# Patient Record
Sex: Male | Born: 1975 | Race: Black or African American | Hispanic: No | Marital: Single | Smoking: Current every day smoker
Health system: Southern US, Community
[De-identification: ages and names within clinical notes are randomized; demographics above are authoritative.]

## PROBLEM LIST (undated history)

## (undated) DIAGNOSIS — T148XXA Other injury of unspecified body region, initial encounter: Secondary | ICD-10-CM

## (undated) DIAGNOSIS — I10 Essential (primary) hypertension: Secondary | ICD-10-CM

## (undated) HISTORY — PX: OTHER SURGICAL HISTORY: SHX169

---

## 1999-12-23 ENCOUNTER — Emergency Department (HOSPITAL_COMMUNITY): Admission: EM | Admit: 1999-12-23 | Discharge: 1999-12-23 | Payer: Self-pay | Admitting: Emergency Medicine

## 2000-01-12 ENCOUNTER — Encounter: Payer: Self-pay | Admitting: Emergency Medicine

## 2000-01-12 ENCOUNTER — Emergency Department (HOSPITAL_COMMUNITY): Admission: EM | Admit: 2000-01-12 | Discharge: 2000-01-12 | Payer: Self-pay | Admitting: Internal Medicine

## 2000-01-14 ENCOUNTER — Emergency Department (HOSPITAL_COMMUNITY): Admission: EM | Admit: 2000-01-14 | Discharge: 2000-01-14 | Payer: Self-pay | Admitting: Emergency Medicine

## 2000-01-21 ENCOUNTER — Emergency Department (HOSPITAL_COMMUNITY): Admission: EM | Admit: 2000-01-21 | Discharge: 2000-01-21 | Payer: Self-pay | Admitting: Emergency Medicine

## 2000-04-12 ENCOUNTER — Emergency Department (HOSPITAL_COMMUNITY): Admission: EM | Admit: 2000-04-12 | Discharge: 2000-04-12 | Payer: Self-pay | Admitting: Emergency Medicine

## 2003-11-17 ENCOUNTER — Emergency Department (HOSPITAL_COMMUNITY): Admission: EM | Admit: 2003-11-17 | Discharge: 2003-11-17 | Payer: Self-pay | Admitting: Emergency Medicine

## 2004-05-24 ENCOUNTER — Emergency Department (HOSPITAL_COMMUNITY): Admission: EM | Admit: 2004-05-24 | Discharge: 2004-05-24 | Payer: Self-pay | Admitting: *Deleted

## 2005-05-18 ENCOUNTER — Emergency Department (HOSPITAL_COMMUNITY): Admission: EM | Admit: 2005-05-18 | Discharge: 2005-05-18 | Payer: Self-pay | Admitting: Emergency Medicine
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
## 2007-01-06 ENCOUNTER — Emergency Department (HOSPITAL_COMMUNITY): Admission: EM | Admit: 2007-01-06 | Discharge: 2007-01-06 | Payer: Self-pay | Admitting: Emergency Medicine

## 2007-09-13 ENCOUNTER — Emergency Department (HOSPITAL_COMMUNITY): Admission: EM | Admit: 2007-09-13 | Discharge: 2007-09-13 | Payer: Self-pay | Admitting: Family Medicine

## 2008-03-10 ENCOUNTER — Emergency Department (HOSPITAL_COMMUNITY): Admission: EM | Admit: 2008-03-10 | Discharge: 2008-03-10 | Payer: Self-pay | Admitting: Emergency Medicine

## 2008-06-12 ENCOUNTER — Emergency Department (HOSPITAL_COMMUNITY): Admission: EM | Admit: 2008-06-12 | Discharge: 2008-06-12 | Payer: Self-pay | Admitting: Emergency Medicine

## 2011-05-23 LAB — DIFFERENTIAL
Basophils Absolute: 0
Basophils Relative: 1
Eosinophils Absolute: 0.1
Eosinophils Relative: 1
Lymphocytes Relative: 24
Lymphs Abs: 1.7
Monocytes Absolute: 0.7
Monocytes Relative: 9
Neutro Abs: 4.7
Neutrophils Relative %: 65

## 2011-05-23 LAB — POCT I-STAT, CHEM 8
Calcium, Ion: 1.19
Creatinine, Ser: 1.2
Glucose, Bld: 92
HCT: 48
Hemoglobin: 16.3
Potassium: 4.4
TCO2: 29

## 2011-05-23 LAB — POCT CARDIAC MARKERS
CKMB, poc: 1.5
Myoglobin, poc: 27.9
Operator id: 234501

## 2011-05-23 LAB — CBC
HCT: 43.2
Hemoglobin: 14.8
MCHC: 34.2
MCV: 93.6
Platelets: 170
RBC: 4.62
RDW: 14
WBC: 7.1

## 2019-12-24 ENCOUNTER — Ambulatory Visit: Payer: Self-pay | Attending: Internal Medicine

## 2019-12-24 DIAGNOSIS — Z23 Encounter for immunization: Secondary | ICD-10-CM

## 2019-12-24 NOTE — Progress Notes (Signed)
   Covid-19 Vaccination Clinic  Name:  Warren Roth    MRN: [id number] DOB: 1975-08-30  12/24/2019  Mr. Hingle was observed post Covid-19 immunization for 15 minutes without incident. He was provided with Vaccine Information Sheet and instruction to access the V-Safe system.   Mr. Frazer was instructed to call 911 with any severe reactions post vaccine: Marland Kitchen Difficulty breathing  . Swelling of face and throat  . A fast heartbeat  . A bad rash all over body  . Dizziness and weakness   Immunizations Administered    Name Date Dose VIS Date Route   Pfizer COVID-19 Vaccine 12/24/2019 11:37 AM 0.3 mL 10/19/2018 Intramuscular   Manufacturer: [employer], [employer]   Lot: [id number]   NDC: [address]-3

## 2020-01-19 ENCOUNTER — Ambulatory Visit: Payer: Self-pay

## 2020-01-19 ENCOUNTER — Ambulatory Visit: Payer: Self-pay | Attending: Internal Medicine

## 2020-01-19 DIAGNOSIS — Z23 Encounter for immunization: Secondary | ICD-10-CM

## 2020-01-19 NOTE — Progress Notes (Signed)
° °  Covid-19 Vaccination Clinic  Name:  Warren Roth    MRN: [id number] DOB: 1976-02-13  01/19/2020  [name] was observed post Covid-19 immunization for 15 minutes without incident. He was provided with Vaccine Information Sheet and instruction to access the V-Safe system.   [name] was instructed to call 911 with any severe reactions post vaccine:  Difficulty breathing   Swelling of face and throat   A fast heartbeat   A bad rash all over body   Dizziness and weakness   Immunizations Administered    Name Date Dose VIS Date Route   Pfizer COVID-19 Vaccine 01/19/2020  8:14 AM 0.3 mL 10/19/2018 Intramuscular   Manufacturer: [employer], [employer]   Lot: [id number]   NDC: [address]-7

## 2020-01-21 ENCOUNTER — Ambulatory Visit: Payer: Self-pay

## 2020-07-07 ENCOUNTER — Ambulatory Visit: Payer: Self-pay

## 2020-08-11 ENCOUNTER — Ambulatory Visit: Payer: Self-pay | Attending: Internal Medicine

## 2020-08-11 DIAGNOSIS — Z23 Encounter for immunization: Secondary | ICD-10-CM

## 2020-08-11 NOTE — Progress Notes (Signed)
   Covid-19 Vaccination Clinic  Name:  IMRE VECCHIONE    MRN: [id number] DOB: 08/07/1976  08/11/2020  Mr. Manfredo was observed post Covid-19 immunization for 15 minutes without incident. He was provided with Vaccine Information Sheet and instruction to access the V-Safe system.   Mr. Vogelsang was instructed to call 911 with any severe reactions post vaccine: Marland Kitchen Difficulty breathing  . Swelling of face and throat  . A fast heartbeat  . A bad rash all over body  . Dizziness and weakness   Immunizations Administered    Name Date Dose VIS Date Route   Pfizer COVID-19 Vaccine 08/11/2020  9:49 AM 0.3 mL 06/13/2020 Intramuscular   Manufacturer: [employer], [employer]   Lot: [id number]   NDC: [address]-5

## 2021-02-15 ENCOUNTER — Ambulatory Visit (HOSPITAL_COMMUNITY)
Admission: EM | Admit: 2021-02-15 | Discharge: 2021-02-15 | Disposition: A | Discharge status: Home / Self Care | Payer: 59 | Attending: Internal Medicine | Admitting: Internal Medicine

## 2021-02-15 DIAGNOSIS — Z20822 Contact with and (suspected) exposure to covid-19: Secondary | ICD-10-CM | POA: Insufficient documentation

## 2021-02-15 LAB — SARS CORONAVIRUS 2 (TAT 6-24 HRS): SARS Coronavirus 2: NEGATIVE

## 2021-02-15 NOTE — ED Triage Notes (Signed)
Pt presents for COVID testing. Pt denies any symptoms at this time.

## 2021-03-23 ENCOUNTER — Ambulatory Visit (HOSPITAL_COMMUNITY): Admission: EM | Admit: 2021-03-23 | Discharge: 2021-03-23 | Discharge status: Against Medical Advice | Payer: 59

## 2021-03-23 ENCOUNTER — Other Ambulatory Visit: Payer: Self-pay

## 2021-10-08 ENCOUNTER — Other Ambulatory Visit: Payer: Self-pay

## 2021-10-08 ENCOUNTER — Encounter (HOSPITAL_COMMUNITY): Payer: Self-pay | Admitting: Emergency Medicine

## 2021-10-08 ENCOUNTER — Emergency Department (HOSPITAL_COMMUNITY)
Admission: EM | Admit: 2021-10-08 | Discharge: 2021-10-08 | Disposition: A | Discharge status: Home / Self Care | Payer: 59 | Attending: Emergency Medicine | Admitting: Emergency Medicine

## 2021-10-08 DIAGNOSIS — R369 Urethral discharge, unspecified: Secondary | ICD-10-CM

## 2021-10-08 DIAGNOSIS — N342 Other urethritis: Secondary | ICD-10-CM | POA: Insufficient documentation

## 2021-10-08 DIAGNOSIS — I10 Essential (primary) hypertension: Secondary | ICD-10-CM

## 2021-10-08 HISTORY — DX: Essential (primary) hypertension: I10

## 2021-10-08 LAB — CBC WITH DIFFERENTIAL/PLATELET
Abs Immature Granulocytes: 0.01 10*3/uL (ref 0.00–0.07)
Basophils Absolute: 0 10*3/uL (ref 0.0–0.1)
Basophils Relative: 1 %
Eosinophils Absolute: 0 10*3/uL (ref 0.0–0.5)
Eosinophils Relative: 0 %
HCT: 35.8 % — ABNORMAL LOW (ref 39.0–52.0)
Hemoglobin: 12.1 g/dL — ABNORMAL LOW (ref 13.0–17.0)
Immature Granulocytes: 0 %
Lymphocytes Relative: 55 %
Lymphs Abs: 4.3 10*3/uL — ABNORMAL HIGH (ref 0.7–4.0)
MCH: 35.5 pg — ABNORMAL HIGH (ref 26.0–34.0)
MCHC: 33.8 g/dL (ref 30.0–36.0)
MCV: 105 fL — ABNORMAL HIGH (ref 80.0–100.0)
Monocytes Absolute: 0.8 10*3/uL (ref 0.1–1.0)
Monocytes Relative: 10 %
Neutro Abs: 2.6 10*3/uL (ref 1.7–7.7)
Neutrophils Relative %: 34 %
Platelets: 193 10*3/uL (ref 150–400)
RBC: 3.41 MIL/uL — ABNORMAL LOW (ref 4.22–5.81)
RDW: 13.1 % (ref 11.5–15.5)
WBC: 7.7 10*3/uL (ref 4.0–10.5)
nRBC: 0 % (ref 0.0–0.2)

## 2021-10-08 LAB — URINALYSIS, ROUTINE W REFLEX MICROSCOPIC
Bilirubin Urine: NEGATIVE
Glucose, UA: NEGATIVE mg/dL
Ketones, ur: 20 mg/dL — AB
Nitrite: NEGATIVE
Protein, ur: 30 mg/dL — AB
Specific Gravity, Urine: 1.017 (ref 1.005–1.030)
WBC, UA: >50 WBC/hpf — ABNORMAL HIGH (ref 0–5)
pH: 7 (ref 5.0–8.0)

## 2021-10-08 LAB — BASIC METABOLIC PANEL
Anion gap: 13 (ref 5–15)
BUN: 12 mg/dL (ref 6–20)
CO2: 24 mmol/L (ref 22–32)
Calcium: 9.5 mg/dL (ref 8.9–10.3)
Chloride: 100 mmol/L (ref 98–111)
Creatinine, Ser: 0.67 mg/dL (ref 0.61–1.24)
GFR, Estimated: >60 mL/min (ref >60)
Glucose, Bld: 91 mg/dL (ref 70–99)
Potassium: 3.3 mmol/L — ABNORMAL LOW (ref 3.5–5.1)
Sodium: 137 mmol/L (ref 135–145)

## 2021-10-08 LAB — HIV ANTIBODY (ROUTINE TESTING W REFLEX): HIV Screen 4th Generation wRfx: NONREACTIVE

## 2021-10-08 MED ORDER — DOXYCYCLINE HYCLATE 100 MG PO CAPS: 100.0000 mg | ORAL_CAPSULE | Freq: Two times a day (BID) | ORAL | 0 refills | Status: DC

## 2021-10-08 MED ORDER — CEFTRIAXONE SODIUM 500 MG IJ SOLR
500.0000 mg | Freq: Once | INTRAMUSCULAR | Status: AC
  Administered 2021-10-08: 500 mg via INTRAMUSCULAR
  Filled 2021-10-08: qty 500

## 2021-10-08 NOTE — ED Provider Notes (Signed)
[facility] EMERGENCY DEPARTMENT Provider Note   CSN: [id number] Arrival date & time: 10/08/21  1850     History  Chief Complaint  Patient presents with   Exposure to STD    Warren Roth is a 46 y.o. male.  HPI He reports onset of urethral drainage, 2 days ago associated with dysuria.  He describes protected sex, several days ago.  No other complaints.    Home Medications Prior to Admission medications   Medication Sig Start Date End Date Taking? Authorizing Provider  doxycycline (VIBRAMYCIN) 100 MG capsule Take 1 capsule (100 mg total) by mouth 2 (two) times daily. One po bid x 7 days 10/08/21  Yes Mancel Bale, MD      Allergies    Patient has no known allergies.    Review of Systems   Review of Systems  Physical Exam Updated Vital Signs BP (!) 170/129 (BP Location: Right Arm)    Pulse 78    Temp 98.3 F (36.8 C) (Oral)    Resp 18    Ht 5' 6.5" (1.689 m)    Wt 68 kg    SpO2 98%    BMI 23.85 kg/m  Physical Exam Vitals and nursing note reviewed.  Constitutional:      General: He is not in acute distress.    Appearance: He is well-developed. He is not ill-appearing or diaphoretic.  HENT:     Head: Normocephalic and atraumatic.     Right Ear: External ear normal.     Left Ear: External ear normal.  Eyes:     Conjunctiva/sclera: Conjunctivae normal.     Pupils: Pupils are equal, round, and reactive to light.  Neck:     Trachea: Phonation normal.  Cardiovascular:     Rate and Rhythm: Normal rate.  Pulmonary:     Effort: Pulmonary effort is normal.  Abdominal:     General: There is no distension.  Genitourinary:    Comments: Circumcised, purulent urethral discharge present.  No other penile abnormality.  Normal scrotum and scrotal contents. Musculoskeletal:        General: Normal range of motion.     Cervical back: Normal range of motion and neck supple.  Skin:    General: Skin is warm and dry.  Neurological:     Mental Status: He is  alert and oriented to person, place, and time.     Cranial Nerves: No cranial nerve deficit.     Sensory: No sensory deficit.     Motor: No abnormal muscle tone.     Coordination: Coordination normal.  Psychiatric:        Mood and Affect: Mood normal.        Behavior: Behavior normal.        Thought Content: Thought content normal.        Judgment: Judgment normal.    ED Results / Procedures / Treatments   Labs (all labs ordered are listed, but only abnormal results are displayed) Labs Reviewed  BASIC METABOLIC PANEL - Abnormal; Notable for the following components:      Result Value   Potassium 3.3 (*)    All other components within normal limits  CBC WITH DIFFERENTIAL/PLATELET - Abnormal; Notable for the following components:   RBC 3.41 (*)    Hemoglobin 12.1 (*)    HCT 35.8 (*)    MCV 105.0 (*)    MCH 35.5 (*)    Lymphs Abs 4.3 (*)    All other components  within normal limits  URINALYSIS, ROUTINE W REFLEX MICROSCOPIC - Abnormal; Notable for the following components:   APPearance HAZY (*)    Hgb urine dipstick SMALL (*)    Ketones, ur 20 (*)    Protein, ur 30 (*)    Leukocytes,Ua LARGE (*)    WBC, UA >50 (*)    Bacteria, UA RARE (*)    All other components within normal limits  HIV ANTIBODY (ROUTINE TESTING W REFLEX)  GC/CHLAMYDIA PROBE AMP ([place]) NOT AT [facility]    EKG None  Radiology No results found.  Procedures Procedures    Medications Ordered in ED Medications  cefTRIAXone (ROCEPHIN) injection 500 mg (500 mg Intramuscular Given 10/08/21 2200)    ED Course/ Medical Decision Making/ A&P                           Medical Decision Making Patient presenting for evaluation of urethral discharge after unprotected sex with a new partner  Amount and/or Complexity of Data Reviewed Independent Historian:     Details: He is a cogent historian Labs: ordered.    Details: CBC, metabolic panel, urinalysis, GC chlamydia culture from urine-normal except  potassium low, hemoglobin low, MCV high, urinalysis abnormal with increased white cells, protein and ketones.  At time of discharge, HIV antibody, GC and chlamydia pending.  Risk Prescription drug management. Decision regarding hospitalization. Risk Details: Patient with signs and symptoms of gonococcal urethritis.  Patient will be treated for both GC and chlamydia.  Patient had incidental high blood pressure noted, rechecked and still high.  He has not had any signs or symptoms of hypertensive urgency.  He is informed that his blood pressure is high and the importance of getting it checked and treated.  No indication for hospitalization at this time.             Final Clinical Impression(s) / ED Diagnoses Final diagnoses:  Penile discharge  Urethritis  Hypertension, unspecified type    Rx / DC Orders ED Discharge Orders          Ordered    doxycycline (VIBRAMYCIN) 100 MG capsule  2 times daily        10/08/21 2146              Mancel Bale, MD 10/08/21 2215

## 2021-10-08 NOTE — ED Provider Triage Note (Signed)
Emergency Medicine Provider Triage Evaluation Note  Warren Roth , a 46 y.o. male  was evaluated in triage.  Pt complains of STD exposure.  "Messed with the wrong woman on Monday".  He is having penile discharge and dysuria.  No hematuria, no fevers, no testicular pain or swelling.    He has been out of his blood pressure medicine, unsure which medicine it is, "it is 1 of those fluid pills".    Review of Systems  Positive: PENILE DISCHARGE Negative: Denies headaches, vision changes, chest pain, shortness of breath  Physical Exam  BP (!) 191/139 (BP Location: Right Arm)    Pulse 97    Temp 99.5 F (37.5 C) (Oral)    Resp 14    SpO2 100%  Gen:   Awake, no distress   Resp:  Normal effort  MSK:   Moves extremities without difficulty  Other:  Genital exam deferred  Medical Decision Making  Medically screening exam initiated at 6:58 PM.  Appropriate orders placed.  Warren Roth was informed that the remainder of the evaluation will be completed by another provider, this initial triage assessment does not replace that evaluation, and the importance of remaining in the ED until their evaluation is complete.     Sherrill Raring, PA-C 10/08/21 1859

## 2021-10-08 NOTE — Discharge Instructions (Addendum)
No sex for 2 weeks.  Take the doxycycline prescription as directed after you get it filled at the pharmacy.  Your blood pressure is high tonight.  Use the resource guide attached to this document to help you find a doctor to see and get it checked in the next week or 2.  In the meantime, stay on a low-salt diet to help lower your blood pressure.  Return here if you develop headache, weakness or dizziness.

## 2021-10-08 NOTE — ED Triage Notes (Signed)
Patient requesting STD check. Reports unprotected sexual intercourse. C/o penile discharge.

## 2021-10-11 ENCOUNTER — Ambulatory Visit: Payer: Self-pay | Admitting: *Deleted

## 2021-10-11 ENCOUNTER — Ambulatory Visit (HOSPITAL_COMMUNITY)
Admission: EM | Admit: 2021-10-11 | Discharge: 2021-10-11 | Disposition: A | Discharge status: Home / Self Care | Payer: Self-pay

## 2021-10-11 ENCOUNTER — Encounter (HOSPITAL_COMMUNITY): Payer: Self-pay | Admitting: *Deleted

## 2021-10-11 DIAGNOSIS — Z716 Tobacco abuse counseling: Secondary | ICD-10-CM

## 2021-10-11 DIAGNOSIS — D7589 Other specified diseases of blood and blood-forming organs: Secondary | ICD-10-CM

## 2021-10-11 DIAGNOSIS — D649 Anemia, unspecified: Secondary | ICD-10-CM

## 2021-10-11 DIAGNOSIS — F1910 Other psychoactive substance abuse, uncomplicated: Secondary | ICD-10-CM

## 2021-10-11 DIAGNOSIS — R030 Elevated blood-pressure reading, without diagnosis of hypertension: Secondary | ICD-10-CM

## 2021-10-11 HISTORY — DX: Other injury of unspecified body region, initial encounter: T14.8XXA

## 2021-10-11 NOTE — ED Triage Notes (Signed)
Pt states was in ED 10/08/21 for STD check; was told he needs to have blood pressure addressed at [facility] until he can get in with PCP (states has appt in a week).  States hasn't been on meds for HTN since 2019. Denies HAs or vision changes.

## 2021-10-11 NOTE — Discharge Instructions (Signed)
Your blood pressure upon recheck was normal in office today. You do not need blood pressure medication. Your elevations in blood pressure are likely related to your lifestyle choices. You must cut back on alcohol intake. You must cut back on smoking. Please limit or stop marijuana use. Too much salt can also cause elevated blood pressure. Establish care with a PCP and follow-up in the next several weeks. You do need to have your anemia monitored.

## 2021-10-11 NOTE — Telephone Encounter (Signed)
° ° ° °  Chief Complaint: Hypertension Symptoms: "Dizzy at times, not presently. BP in ED yesterday 191/139. Did not stay in ED Frequency: Yesterday Pertinent Negatives: Patient denies headache, weakness Disposition: [] ED /[x] Urgent Care (no appt availability in office) / [] Appointment(In office/virtual)/ []  [facility]/ [] Home Care/ [] Refused Recommended Disposition /[] [facility]/ []  Follow-up with PCP Additional Notes:  Reason for Disposition  [1] Systolic BP  >= 200 OR Diastolic >= 120 AND [2] having NO cardiac or neurologic symptoms  Answer Assessment - Initial Assessment Questions 1. BLOOD PRESSURE: "What is the blood pressure?" "Did you take at least two measurements 5 minutes apart?"     191/139 2. ONSET: "When did you take your blood pressure?"     In ED yesterday 3. HOW: "How did you obtain the blood pressure?" (e.g., visiting nurse, automatic home BP monitor)     In ED 4. HISTORY: "Do you have a history of high blood pressure?"     Yes 5. MEDICATIONS: "Are you taking any medications for blood pressure?" "Have you missed any doses recently?"     none 6. OTHER SYMPTOMS: "Do you have any symptoms?" (e.g., headache, chest pain, blurred vision, difficulty breathing, weakness)     Dizzy at times  Protocols used: Blood Pressure - High-A-AH
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
## 2021-10-15 ENCOUNTER — Inpatient Hospital Stay (INDEPENDENT_AMBULATORY_CARE_PROVIDER_SITE_OTHER): Payer: Self-pay | Admitting: Primary Care

## 2021-10-25 ENCOUNTER — Ambulatory Visit: Payer: Self-pay | Admitting: Nurse Practitioner

## 2021-12-05 ENCOUNTER — Ambulatory Visit: Payer: Self-pay | Admitting: Family Medicine

## 2022-05-13 ENCOUNTER — Encounter (HOSPITAL_BASED_OUTPATIENT_CLINIC_OR_DEPARTMENT_OTHER): Payer: Self-pay | Admitting: *Deleted

## 2022-05-13 ENCOUNTER — Emergency Department (HOSPITAL_BASED_OUTPATIENT_CLINIC_OR_DEPARTMENT_OTHER)
Admission: EM | Admit: 2022-05-13 | Discharge: 2022-05-13 | Disposition: A | Discharge status: Home / Self Care | Payer: Self-pay | Attending: Emergency Medicine | Admitting: Emergency Medicine

## 2022-05-13 DIAGNOSIS — I10 Essential (primary) hypertension: Secondary | ICD-10-CM | POA: Insufficient documentation

## 2022-05-13 DIAGNOSIS — Z720 Tobacco use: Secondary | ICD-10-CM

## 2022-05-13 DIAGNOSIS — F17210 Nicotine dependence, cigarettes, uncomplicated: Secondary | ICD-10-CM | POA: Insufficient documentation

## 2022-05-13 DIAGNOSIS — Z79899 Other long term (current) drug therapy: Secondary | ICD-10-CM | POA: Insufficient documentation

## 2022-05-13 LAB — COMPREHENSIVE METABOLIC PANEL
ALT: 44 U/L (ref 0–44)
AST: 173 U/L — ABNORMAL HIGH (ref 15–41)
Albumin: 4.3 g/dL (ref 3.5–5.0)
Alkaline Phosphatase: 65 U/L (ref 38–126)
Anion gap: 15 (ref 5–15)
BUN: 11 mg/dL (ref 6–20)
CO2: 27 mmol/L (ref 22–32)
Calcium: 9.1 mg/dL (ref 8.9–10.3)
Chloride: 95 mmol/L — ABNORMAL LOW (ref 98–111)
Creatinine, Ser: 0.65 mg/dL (ref 0.61–1.24)
GFR, Estimated: >60 mL/min (ref >60)
Glucose, Bld: 88 mg/dL (ref 70–99)
Potassium: 3.2 mmol/L — ABNORMAL LOW (ref 3.5–5.1)
Sodium: 137 mmol/L (ref 135–145)
Total Bilirubin: 0.8 mg/dL (ref 0.3–1.2)
Total Protein: 8.2 g/dL — ABNORMAL HIGH (ref 6.5–8.1)

## 2022-05-13 LAB — CBC WITH DIFFERENTIAL/PLATELET
Abs Immature Granulocytes: 0.01 10*3/uL (ref 0.00–0.07)
Basophils Absolute: 0 10*3/uL (ref 0.0–0.1)
Basophils Relative: 1 %
Eosinophils Absolute: 0 10*3/uL (ref 0.0–0.5)
Eosinophils Relative: 0 %
HCT: 37.3 % — ABNORMAL LOW (ref 39.0–52.0)
Hemoglobin: 13 g/dL (ref 13.0–17.0)
Immature Granulocytes: 0 %
Lymphocytes Relative: 48 %
Lymphs Abs: 1.9 10*3/uL (ref 0.7–4.0)
MCH: 34.1 pg — ABNORMAL HIGH (ref 26.0–34.0)
MCHC: 34.9 g/dL (ref 30.0–36.0)
MCV: 97.9 fL (ref 80.0–100.0)
Monocytes Absolute: 0.4 10*3/uL (ref 0.1–1.0)
Monocytes Relative: 10 %
Neutro Abs: 1.6 10*3/uL — ABNORMAL LOW (ref 1.7–7.7)
Neutrophils Relative %: 41 %
Platelets: 137 10*3/uL — ABNORMAL LOW (ref 150–400)
RBC: 3.81 MIL/uL — ABNORMAL LOW (ref 4.22–5.81)
RDW: 13.2 % (ref 11.5–15.5)
WBC: 3.9 10*3/uL — ABNORMAL LOW (ref 4.0–10.5)
nRBC: 0 % (ref 0.0–0.2)

## 2022-05-13 MED ORDER — LISINOPRIL 10 MG PO TABS: 10.0000 mg | ORAL_TABLET | Freq: Every day | ORAL | 0 refills | Status: DC

## 2022-05-13 NOTE — ED Provider Notes (Signed)
[facility] EMERGENCY DEPARTMENT Provider Note   CSN: [id number] Arrival date & time: 05/13/22  0804     History  Chief Complaint  Patient presents with   Hypertension    Warren FORRESTER is a 46 y.o. male.  Pt is a 46 yo male with PMH of HTN noncompliant with BP meds due to insurance changes in 2020/2021 presenting for elevated blood pressure. Had elevated BP reading during new job screening with BP 182/116. Currently 156/116 on arrival to ED. Denies headache, blurred vision, sensation/motor dysfunction, or dizziness. Pt admits to drinking 1 pint of alcohol a day. Smokes cigarettes daily.   The history is provided by the patient. No language interpreter was used.  Hypertension Pertinent negatives include no chest pain, no abdominal pain and no shortness of breath.       Home Medications Prior to Admission medications   Medication Sig Start Date End Date Taking? Authorizing Provider  lisinopril (ZESTRIL) 10 MG tablet Take 1 tablet (10 mg total) by mouth daily. 05/13/22 06/12/22 Yes Edwin Dada P, DO  doxycycline (VIBRAMYCIN) 100 MG capsule Take 1 capsule (100 mg total) by mouth 2 (two) times daily. One po bid x 7 days 10/08/21   Mancel Bale, MD      Allergies    Other    Review of Systems   Review of Systems  Constitutional:  Negative for chills and fever.  HENT:  Negative for ear pain and sore throat.   Eyes:  Negative for pain and visual disturbance.  Respiratory:  Negative for cough and shortness of breath.   Cardiovascular:  Negative for chest pain and palpitations.  Gastrointestinal:  Negative for abdominal pain and vomiting.  Genitourinary:  Negative for dysuria and hematuria.  Musculoskeletal:  Negative for arthralgias and back pain.  Skin:  Negative for color change and rash.  Neurological:  Negative for seizures and syncope.  All other systems reviewed and are negative.   Physical Exam Updated Vital Signs BP (!) 168/124 (BP Location: Right Arm)    Pulse 94   Temp 98.3 F (36.8 C) (Oral)   Resp 18   Ht 5\' 7"  (1.702 m)   Wt 62.1 kg   SpO2 99%   BMI 21.46 kg/m  Physical Exam Vitals and nursing note reviewed.  Constitutional:      General: He is not in acute distress.    Appearance: He is well-developed.  HENT:     Head: Normocephalic and atraumatic.  Eyes:     Conjunctiva/sclera: Conjunctivae normal.  Cardiovascular:     Rate and Rhythm: Normal rate and regular rhythm.     Heart sounds: No murmur heard. Pulmonary:     Effort: Pulmonary effort is normal. No respiratory distress.     Breath sounds: Normal breath sounds.  Abdominal:     Palpations: Abdomen is soft.     Tenderness: There is no abdominal tenderness.  Musculoskeletal:        General: No swelling.     Cervical back: Neck supple.  Skin:    General: Skin is warm and dry.     Capillary Refill: Capillary refill takes less than 2 seconds.  Neurological:     Mental Status: He is alert.  Psychiatric:        Mood and Affect: Mood normal.     ED Results / Procedures / Treatments   Labs (all labs ordered are listed, but only abnormal results are displayed) Labs Reviewed  CBC WITH DIFFERENTIAL/PLATELET - Abnormal; Notable  for the following components:      Result Value   WBC 3.9 (*)    RBC 3.81 (*)    HCT 37.3 (*)    MCH 34.1 (*)    Platelets 137 (*)    Neutro Abs 1.6 (*)    All other components within normal limits  COMPREHENSIVE METABOLIC PANEL    EKG None  Radiology No results found.  Procedures Procedures    Medications Ordered in ED Medications - No data to display  ED Course/ Medical Decision Making/ A&P                           Medical Decision Making Amount and/or Complexity of Data Reviewed Labs: ordered.  Risk Prescription drug management.   8:59 AM 46 yo male with PMH of HTN noncompliant with BP meds due to insurance changes in 2020/2021 presenting for elevated blood pressure.  Patient is alert oriented x3, no acute  distress, afebrile, stable vital signs.  Blood pressure currently stable at 156/116.  Patient is asymptomatic at this time.  EKG is stable with no ST segment elevation or depression.  Sinus rhythm present with a rate of 83 bpm.  Doubt hypertensive emergency or urgency at this time.  No labs are necessary.  Will recommend decreasing alcohol consumption, smoking, and initiating blood pressure control with lisinopril.  Patient was originally on hydrochlorothiazide but did not like the increased urination associated with this medication.  I spent at least 3 minutes discussing the dangers of smoking and encouraged them to quit.  Patient in no distress and overall condition improved here in the ED. Detailed discussions were had with the patient regarding current findings, and need for close f/u with PCP or on call doctor. The patient has been instructed to return immediately if the symptoms worsen in any way for re-evaluation. Patient verbalized understanding and is in agreement with current care plan. All questions answered prior to discharge.   Discharge instructions are as follows:  Take blood pressure medication Lisinopril 10 mg daily. Recheck blood pressure in one month. This medication can be adjusted if blood pressure remains high or is dropping too low.  Decrease salt intake.   Decrease alcohol consumption.  Exercise 3 x a week.   Stop smoking or discuss with primary physician when ready.  Please establish care with a primary care physician or go to the the [facility] for re-evaluaiton of blood pressure after 1 month.   Stop blood pressure medication if symptoms of light headedness or dizziness develop and blood pressure reading is less than 120/80.         Final Clinical Impression(s) / ED Diagnoses Final diagnoses:  Essential hypertension  Tobacco use    Rx / DC Orders ED Discharge Orders          Ordered    lisinopril (ZESTRIL) 10 MG tablet   Daily        05/13/22 0856              Lianne Cure, DO 41/66/06 0900

## 2022-05-13 NOTE — Discharge Instructions (Addendum)
Take blood pressure medication Lisinopril 10 mg daily. Recheck blood pressure in one month. This medication can be adjusted if blood pressure remains high or is dropping too low.  Decrease salt intake.   Decrease alcohol consumption.  Exercise 3 x a week.   Stop smoking or discuss with primary physician when ready.  Please establish care with a primary care physician or go to the the [facility] and [facility] for re-evaluaiton of blood pressure after 1 month.   Stop blood pressure medication if symptoms of light headedness or dizziness develop and blood pressure reading is less than 120/80.

## 2022-05-13 NOTE — ED Triage Notes (Signed)
Here for having elevated BP readings. Does not take any medications. Denies any chest pain, Headaches, Shortness of Breath. Voices no complaints. No changes in Neuro status

## 2022-05-13 NOTE — ED Notes (Signed)
Here for evaluation of elevated BP readings. Denies any vision issues, neuro issues, dizziness, or chest pain. MAE x 4, has strong grips and strong plantar and dorsal flexion bilaterally. BEFAST and VAN Negative.
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
## 2022-06-20 ENCOUNTER — Encounter: Payer: Self-pay | Admitting: Student

## 2022-06-20 ENCOUNTER — Other Ambulatory Visit: Payer: Self-pay

## 2022-06-20 ENCOUNTER — Ambulatory Visit (INDEPENDENT_AMBULATORY_CARE_PROVIDER_SITE_OTHER): Payer: Managed Care, Other (non HMO) | Admitting: Student

## 2022-06-20 VITALS — BP 137/102 | HR 89 | Temp 98.4°F | Resp 28 | Ht 66.0 in | Wt 132.4 lb

## 2022-06-20 DIAGNOSIS — Z131 Encounter for screening for diabetes mellitus: Secondary | ICD-10-CM | POA: Diagnosis not present

## 2022-06-20 DIAGNOSIS — F17210 Nicotine dependence, cigarettes, uncomplicated: Secondary | ICD-10-CM

## 2022-06-20 DIAGNOSIS — Z13220 Encounter for screening for lipoid disorders: Secondary | ICD-10-CM | POA: Diagnosis not present

## 2022-06-20 DIAGNOSIS — F1090 Alcohol use, unspecified, uncomplicated: Secondary | ICD-10-CM | POA: Diagnosis not present

## 2022-06-20 DIAGNOSIS — I10 Essential (primary) hypertension: Secondary | ICD-10-CM

## 2022-06-20 LAB — POCT GLYCOSYLATED HEMOGLOBIN (HGB A1C): Hemoglobin A1C: 5.5 % (ref 4.0–5.6)

## 2022-06-20 LAB — GLUCOSE, CAPILLARY: Glucose-Capillary: 83 mg/dL (ref 70–99)

## 2022-06-20 LAB — PROTIME-INR
INR: 1 (ref 0.8–1.2)
Prothrombin Time: 12.6 seconds (ref 11.4–15.2)

## 2022-06-20 MED ORDER — LISINOPRIL 10 MG PO TABS: 10.0000 mg | ORAL_TABLET | Freq: Every day | ORAL | 11 refills | Status: AC

## 2022-06-20 NOTE — Assessment & Plan Note (Addendum)
Patient endorses alcohol use daily. He drinks beer and liquor. He reports buying a case of beer that will last at most a week. Started when he was 46 years old. He endorses carrying airplane bottles of liquor with him at times. He states drinking alcohol is his way of escaping. Mother is concerned about his alcohol use. Has not tried to quit. Denies withdrawal symptoms. We extensively discussed the harms of excessive alcohol use.  He is concerned about his health given last AST was elevated. Will check labs and imaging to assess for cirrhosis or any signs of liver damage.   Plan -CBC, CMP, PT-INR today -RUQ ultrasound and liver elastography -continue alcohol use counseling

## 2022-06-20 NOTE — Assessment & Plan Note (Addendum)
A1c today was 5.5%.

## 2022-06-20 NOTE — Patient Instructions (Addendum)
Thank you, [name] for allowing [place] to provide your care today.   -Blood Pressure: I will refill your lisinopril. Please take it daily. We will check your electrolytes and kidney function today.  -Alcohol Use: We will do blood work today to check your liver enzymes and function. I have sent a referral for ultrasound of the liver. We discussed cutting back on all alcohol given concern for liver damage.   -You completed blood work today. I will contact you with results.   I have ordered the following labs for you:  Lab Orders         CMP14 + Anion Gap         CBC no Diff         Lipid Profile         Protime-INR         POC Hbg A1C       I have ordered the following medication/changed the following medications:   Stop the following medications: Medications Discontinued During This Encounter  Medication Reason   lisinopril (ZESTRIL) 10 MG tablet Reorder     Start the following medications: Meds ordered this encounter  Medications   lisinopril (ZESTRIL) 10 MG tablet    Sig: Take 1 tablet (10 mg total) by mouth daily.    Dispense:  30 tablet    Refill:  11     Follow up:  1-2 months     Should you have any questions or concerns please call the internal medicine clinic at [phone].    Angelique Blonder, D.O. [place]

## 2022-06-20 NOTE — Assessment & Plan Note (Signed)
Pending lipid panel 
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
## 2022-06-20 NOTE — Assessment & Plan Note (Signed)
BP Readings from Last 3 Encounters:  06/20/22 (!) 137/102  05/13/22 (!) 168/124  10/11/21 (!) 133/92   BP elevated today. He is on lisinopril 10 mg daily. Tolerating medication with no side effects. However, does not take it consistently.  He has missed 3-4 doses the past week. Discussed with patient about compliance which he is agreeable to. He prefers not checking his BP at home.    Plan -Encouraged patient to take lisinopril daily -Check CMP today

## 2022-06-21 LAB — LIPID PANEL
Chol/HDL Ratio: 3.1 ratio (ref 0.0–5.0)
Cholesterol, Total: 234 mg/dL — ABNORMAL HIGH (ref 100–199)
HDL: 76 mg/dL (ref >39)
LDL Chol Calc (NIH): 140 mg/dL — ABNORMAL HIGH (ref 0–99)
Triglycerides: 103 mg/dL (ref 0–149)
VLDL Cholesterol Cal: 18 mg/dL (ref 5–40)

## 2022-06-21 LAB — CMP14 + ANION GAP
ALT: 73 IU/L — ABNORMAL HIGH (ref 0–44)
AST: 234 IU/L — ABNORMAL HIGH (ref 0–40)
Albumin/Globulin Ratio: 1.4 (ref 1.2–2.2)
Albumin: 4.3 g/dL (ref 4.1–5.1)
Alkaline Phosphatase: 81 IU/L (ref 44–121)
Anion Gap: 24 mmol/L — ABNORMAL HIGH (ref 10.0–18.0)
BUN/Creatinine Ratio: 14 (ref 9–20)
BUN: 9 mg/dL (ref 6–24)
Bilirubin Total: 0.3 mg/dL (ref 0.0–1.2)
CO2: 20 mmol/L (ref 20–29)
Calcium: 9.2 mg/dL (ref 8.7–10.2)
Chloride: 95 mmol/L — ABNORMAL LOW (ref 96–106)
Creatinine, Ser: 0.66 mg/dL — ABNORMAL LOW (ref 0.76–1.27)
Globulin, Total: 3.1 g/dL (ref 1.5–4.5)
Glucose: 67 mg/dL — ABNORMAL LOW (ref 70–99)
Potassium: 3.3 mmol/L — ABNORMAL LOW (ref 3.5–5.2)
Sodium: 139 mmol/L (ref 134–144)
Total Protein: 7.4 g/dL (ref 6.0–8.5)
eGFR: 119 mL/min/{1.73_m2} (ref >59)

## 2022-06-21 LAB — CBC
Hematocrit: 37 % — ABNORMAL LOW (ref 37.5–51.0)
Hemoglobin: 12.5 g/dL — ABNORMAL LOW (ref 13.0–17.7)
MCH: 34.1 pg — ABNORMAL HIGH (ref 26.6–33.0)
MCHC: 33.8 g/dL (ref 31.5–35.7)
MCV: 101 fL — ABNORMAL HIGH (ref 79–97)
Platelets: 155 10*3/uL (ref 150–450)
RBC: 3.67 x10E6/uL — ABNORMAL LOW (ref 4.14–5.80)
RDW: 14.5 % (ref 11.6–15.4)
WBC: 5.8 10*3/uL (ref 3.4–10.8)

## 2022-06-24 NOTE — Progress Notes (Signed)
Internal Medicine Clinic Attending  I saw and evaluated the patient.  I personally confirmed the key portions of the history and exam documented by Dr. Zheng and I reviewed pertinent patient test results.  The assessment, diagnosis, and plan were formulated together and I agree with the documentation in the resident's note.  

## 2022-06-25 ENCOUNTER — Telehealth: Payer: Self-pay

## 2022-06-25 NOTE — Telephone Encounter (Signed)
Patient called he is waiting for his rx for cholesterol to be sent in  [facility] [phone] - [place], [place] Phone: [phone]  Fax: [phone]  Please call patient when rx has been sent in, please lvm if he does not answer

## 2022-07-08 ENCOUNTER — Other Ambulatory Visit: Payer: Self-pay | Admitting: Internal Medicine

## 2022-07-08 ENCOUNTER — Ambulatory Visit (HOSPITAL_COMMUNITY): Payer: Managed Care, Other (non HMO)

## 2022-07-08 ENCOUNTER — Ambulatory Visit (HOSPITAL_COMMUNITY)
Admission: RE | Admit: 2022-07-08 | Discharge: 2022-07-08 | Disposition: A | Discharge status: Home / Self Care | Payer: Managed Care, Other (non HMO) | Source: Ambulatory Visit | Attending: Internal Medicine | Admitting: Internal Medicine

## 2022-07-08 DIAGNOSIS — F1090 Alcohol use, unspecified, uncomplicated: Secondary | ICD-10-CM | POA: Insufficient documentation

## 2022-07-08 DIAGNOSIS — I10 Essential (primary) hypertension: Secondary | ICD-10-CM

## 2022-07-08 DIAGNOSIS — Z13220 Encounter for screening for lipoid disorders: Secondary | ICD-10-CM

## 2022-07-08 DIAGNOSIS — Z131 Encounter for screening for diabetes mellitus: Secondary | ICD-10-CM

## 2022-08-21 ENCOUNTER — Ambulatory Visit (HOSPITAL_COMMUNITY)
Admission: EM | Admit: 2022-08-21 | Discharge: 2022-08-21 | Disposition: A | Discharge status: Other Acute Inpatient Hospital | Payer: Managed Care, Other (non HMO)

## 2022-08-21 NOTE — ED Notes (Signed)
Pt cursing and swearing while yelling at registration staff. Registration called security who escorted patient out of department.

## 2022-11-26 ENCOUNTER — Emergency Department (HOSPITAL_COMMUNITY)
Admission: EM | Admit: 2022-11-26 | Discharge: 2022-11-26 | Disposition: A | Discharge status: Home / Self Care | Payer: Self-pay | Attending: Emergency Medicine | Admitting: Emergency Medicine

## 2022-11-26 ENCOUNTER — Emergency Department (HOSPITAL_COMMUNITY): Payer: Self-pay

## 2022-11-26 ENCOUNTER — Other Ambulatory Visit: Payer: Self-pay

## 2022-11-26 ENCOUNTER — Encounter (HOSPITAL_COMMUNITY): Payer: Self-pay

## 2022-11-26 DIAGNOSIS — D696 Thrombocytopenia, unspecified: Secondary | ICD-10-CM | POA: Insufficient documentation

## 2022-11-26 DIAGNOSIS — R569 Unspecified convulsions: Secondary | ICD-10-CM | POA: Insufficient documentation

## 2022-11-26 DIAGNOSIS — R7309 Other abnormal glucose: Secondary | ICD-10-CM | POA: Insufficient documentation

## 2022-11-26 DIAGNOSIS — F1090 Alcohol use, unspecified, uncomplicated: Secondary | ICD-10-CM | POA: Insufficient documentation

## 2022-11-26 DIAGNOSIS — W19XXXA Unspecified fall, initial encounter: Secondary | ICD-10-CM | POA: Insufficient documentation

## 2022-11-26 DIAGNOSIS — S0993XA Unspecified injury of face, initial encounter: Secondary | ICD-10-CM | POA: Insufficient documentation

## 2022-11-26 DIAGNOSIS — Z79899 Other long term (current) drug therapy: Secondary | ICD-10-CM | POA: Insufficient documentation

## 2022-11-26 DIAGNOSIS — R7401 Elevation of levels of liver transaminase levels: Secondary | ICD-10-CM | POA: Insufficient documentation

## 2022-11-26 DIAGNOSIS — Y990 Civilian activity done for income or pay: Secondary | ICD-10-CM | POA: Insufficient documentation

## 2022-11-26 DIAGNOSIS — Y900 Blood alcohol level of less than 20 mg/100 ml: Secondary | ICD-10-CM | POA: Insufficient documentation

## 2022-11-26 LAB — CBG MONITORING, ED: Glucose-Capillary: 110 mg/dL — ABNORMAL HIGH (ref 70–99)

## 2022-11-26 LAB — I-STAT CHEM 8, ED
BUN: 19 mg/dL (ref 6–20)
BUN: 19 mg/dL (ref 6–20)
BUN: 19 mg/dL (ref 6–20)
Calcium, Ion: 1.04 mmol/L — ABNORMAL LOW (ref 1.15–1.40)
Calcium, Ion: 1.04 mmol/L — ABNORMAL LOW (ref 1.15–1.40)
Calcium, Ion: 1.04 mmol/L — ABNORMAL LOW (ref 1.15–1.40)
Chloride: 95 mmol/L — ABNORMAL LOW (ref 98–111)
Chloride: 95 mmol/L — ABNORMAL LOW (ref 98–111)
Chloride: 95 mmol/L — ABNORMAL LOW (ref 98–111)
Creatinine, Ser: 0.7 mg/dL (ref 0.61–1.24)
Creatinine, Ser: 0.7 mg/dL (ref 0.61–1.24)
Creatinine, Ser: 0.7 mg/dL (ref 0.61–1.24)
Glucose, Bld: 105 mg/dL — ABNORMAL HIGH (ref 70–99)
Glucose, Bld: 105 mg/dL — ABNORMAL HIGH (ref 70–99)
Glucose, Bld: 105 mg/dL — ABNORMAL HIGH (ref 70–99)
HCT: 37 % — ABNORMAL LOW (ref 39.0–52.0)
HCT: 37 % — ABNORMAL LOW (ref 39.0–52.0)
HCT: 37 % — ABNORMAL LOW (ref 39.0–52.0)
Hemoglobin: 12.6 g/dL — ABNORMAL LOW (ref 13.0–17.0)
Hemoglobin: 12.6 g/dL — ABNORMAL LOW (ref 13.0–17.0)
Hemoglobin: 12.6 g/dL — ABNORMAL LOW (ref 13.0–17.0)
Potassium: 4 mmol/L (ref 3.5–5.1)
Potassium: 4 mmol/L (ref 3.5–5.1)
Potassium: 4 mmol/L (ref 3.5–5.1)
Sodium: 134 mmol/L — ABNORMAL LOW (ref 135–145)
Sodium: 134 mmol/L — ABNORMAL LOW (ref 135–145)
Sodium: 134 mmol/L — ABNORMAL LOW (ref 135–145)
TCO2: 26 mmol/L (ref 22–32)
TCO2: 26 mmol/L (ref 22–32)
TCO2: 26 mmol/L (ref 22–32)

## 2022-11-26 LAB — COMPREHENSIVE METABOLIC PANEL
ALT: 47 U/L — ABNORMAL HIGH (ref 0–44)
AST: 174 U/L — ABNORMAL HIGH (ref 15–41)
Albumin: 3.8 g/dL (ref 3.5–5.0)
Alkaline Phosphatase: 61 U/L (ref 38–126)
Anion gap: 22 — ABNORMAL HIGH (ref 5–15)
BUN: 14 mg/dL (ref 6–20)
CO2: 23 mmol/L (ref 22–32)
Calcium: 9.2 mg/dL (ref 8.9–10.3)
Chloride: 92 mmol/L — ABNORMAL LOW (ref 98–111)
Creatinine, Ser: 0.92 mg/dL (ref 0.61–1.24)
GFR, Estimated: >60 mL/min (ref >60)
Glucose, Bld: 111 mg/dL — ABNORMAL HIGH (ref 70–99)
Potassium: 4 mmol/L (ref 3.5–5.1)
Sodium: 137 mmol/L (ref 135–145)
Total Bilirubin: 1 mg/dL (ref 0.3–1.2)
Total Protein: 7.2 g/dL (ref 6.5–8.1)

## 2022-11-26 LAB — CBC WITH DIFFERENTIAL/PLATELET
Abs Immature Granulocytes: 0.02 10*3/uL (ref 0.00–0.07)
Basophils Absolute: 0 10*3/uL (ref 0.0–0.1)
Basophils Relative: 0 %
Eosinophils Absolute: 0 10*3/uL (ref 0.0–0.5)
Eosinophils Relative: 0 %
HCT: 33.4 % — ABNORMAL LOW (ref 39.0–52.0)
Hemoglobin: 11.8 g/dL — ABNORMAL LOW (ref 13.0–17.0)
Immature Granulocytes: 0 %
Lymphocytes Relative: 16 %
Lymphs Abs: 0.7 10*3/uL (ref 0.7–4.0)
MCH: 35.9 pg — ABNORMAL HIGH (ref 26.0–34.0)
MCHC: 35.3 g/dL (ref 30.0–36.0)
MCV: 101.5 fL — ABNORMAL HIGH (ref 80.0–100.0)
Monocytes Absolute: 0.4 10*3/uL (ref 0.1–1.0)
Monocytes Relative: 9 %
Neutro Abs: 3.5 10*3/uL (ref 1.7–7.7)
Neutrophils Relative %: 75 %
Platelets: 114 10*3/uL — ABNORMAL LOW (ref 150–400)
RBC: 3.29 MIL/uL — ABNORMAL LOW (ref 4.22–5.81)
RDW: 13.3 % (ref 11.5–15.5)
WBC: 4.6 10*3/uL (ref 4.0–10.5)
nRBC: 0 % (ref 0.0–0.2)

## 2022-11-26 LAB — MAGNESIUM: Magnesium: 1.7 mg/dL (ref 1.7–2.4)

## 2022-11-26 LAB — ETHANOL: Alcohol, Ethyl (B): <10 mg/dL (ref <10)

## 2022-11-26 LAB — PHOSPHORUS: Phosphorus: 2.4 mg/dL — ABNORMAL LOW (ref 2.5–4.6)

## 2022-11-26 MED ORDER — ONDANSETRON HCL 4 MG/2ML IJ SOLN
4.0000 mg | Freq: Once | INTRAMUSCULAR | Status: AC
  Administered 2022-11-26: 4 mg via INTRAVENOUS
  Filled 2022-11-26: qty 2

## 2022-11-26 MED ORDER — LORAZEPAM 1 MG PO TABS: 0.0000 mg | ORAL_TABLET | Freq: Two times a day (BID) | ORAL | Status: DC

## 2022-11-26 MED ORDER — LORAZEPAM 2 MG/ML IJ SOLN: 0.0000 mg | Freq: Four times a day (QID) | INTRAMUSCULAR | Status: DC

## 2022-11-26 MED ORDER — LORAZEPAM 2 MG/ML IJ SOLN: 0.0000 mg | Freq: Two times a day (BID) | INTRAMUSCULAR | Status: DC

## 2022-11-26 MED ORDER — LEVETIRACETAM 1000 MG PO TABS: 1000.0000 mg | ORAL_TABLET | Freq: Two times a day (BID) | ORAL | 2 refills | Status: AC

## 2022-11-26 MED ORDER — THIAMINE HCL 100 MG/ML IJ SOLN
100.0000 mg | Freq: Every day | INTRAMUSCULAR | Status: DC
  Administered 2022-11-26: 100 mg via INTRAVENOUS
  Filled 2022-11-26: qty 2

## 2022-11-26 MED ORDER — FENTANYL CITRATE PF 50 MCG/ML IJ SOSY
50.0000 ug | PREFILLED_SYRINGE | Freq: Once | INTRAMUSCULAR | Status: AC
  Administered 2022-11-26: 50 ug via INTRAVENOUS
  Filled 2022-11-26: qty 1

## 2022-11-26 MED ORDER — LORAZEPAM 1 MG PO TABS: 0.0000 mg | ORAL_TABLET | Freq: Four times a day (QID) | ORAL | Status: DC

## 2022-11-26 MED ORDER — THIAMINE MONONITRATE 100 MG PO TABS: 100.0000 mg | ORAL_TABLET | Freq: Every day | ORAL | Status: DC

## 2022-11-26 NOTE — ED Triage Notes (Signed)
Pt bib ems from work c/o seizure. Pt has no known hx but endorse drinking 0.5 pint of alcohol daily. Pt started a new job today. While at work pt fell and had a 4.5 minute seizure that was recorded on work camera. EMS unsure if seizure caused fall or fall caused seizure. Pt was postictal for 45 minutes.   BP 158/106 HR 100 RA 97% CBG 148

## 2022-11-26 NOTE — ED Notes (Signed)
Pt dad at bedside stated pt had two seizures prior to today's event. EDP at bedside

## 2022-11-26 NOTE — ED Provider Notes (Signed)
[facility] Provider Note   CSN: [id number] Arrival date & time: 11/26/22  1044     History  Chief Complaint  Patient presents with   Seizures    Warren Roth is a 47 y.o. male.  New onset seizure at work today. Patient admits to getting half a pint of Bacardi rum daily.  He denies a history of shakes or seizures in the past.  Today was the patient's first day of work, last ingestion of alcohol was last night.  Denies any drug use or other prescription medications.  The history is provided by the EMS personnel, medical records and the patient.  Seizures Seizure activity on arrival: no   Seizure type:  Grand mal Episode characteristics: confusion, disorientation, generalized shaking and tongue biting   Episode characteristics: no incontinence   Postictal symptoms: confusion, memory loss and somnolence   Severity:  Severe Duration:  5 minutes Timing:  Once Progression:  Resolved Context: alcohol withdrawal (? hx of heavy etoh use)   Context: not family hx of seizures   Recent head injury:  No recent head injuries PTA treatment:  None History of seizures: no   Drug / Alcohol Assessment Suspected agents:  Alcohol Associated symptoms: seizures   Associated symptoms: no agitation, no bladder incontinence, no bowel incontinence and no headaches        Home Medications Prior to Admission medications   Medication Sig Start Date End Date Taking? Authorizing Provider  lisinopril (ZESTRIL) 10 MG tablet Take 1 tablet (10 mg total) by mouth daily. 06/20/22 06/15/23  Angelique Blonder, DO      Allergies    Other    Review of Systems   Review of Systems  Gastrointestinal:  Negative for bowel incontinence.  Genitourinary:  Negative for bladder incontinence.  Neurological:  Positive for seizures. Negative for headaches.  Psychiatric/Behavioral:  Negative for agitation.     Physical Exam Updated Vital Signs BP (!) 160/119   Pulse  94  Physical Exam Vitals and nursing note reviewed.  Constitutional:      General: He is not in acute distress.    Appearance: He is well-developed. He is not diaphoretic.     Interventions: Cervical collar in place.  HENT:     Head: Normocephalic and atraumatic.     Mouth/Throat:     Mouth: Mucous membranes are moist.     Comments: Tongue injury R side Eyes:     General: No scleral icterus.    Conjunctiva/sclera: Conjunctivae normal.  Cardiovascular:     Rate and Rhythm: Normal rate and regular rhythm.     Heart sounds: Normal heart sounds.  Pulmonary:     Effort: Pulmonary effort is normal. No respiratory distress.     Breath sounds: Normal breath sounds.  Abdominal:     Palpations: Abdomen is soft.     Tenderness: There is no abdominal tenderness.  Skin:    General: Skin is warm and dry.  Neurological:     Mental Status: He is alert and oriented to person, place, and time.     Cranial Nerves: No cranial nerve deficit.     Sensory: No sensory deficit.     Motor: No weakness.     Coordination: Coordination normal.     Deep Tendon Reflexes: Reflexes normal.  Psychiatric:        Behavior: Behavior normal.     ED Results / Procedures / Treatments   Labs (all labs ordered are listed,  but only abnormal results are displayed) Labs Reviewed  COMPREHENSIVE METABOLIC PANEL  CBC WITH DIFFERENTIAL/PLATELET  MAGNESIUM  PHOSPHORUS  ETHANOL  RAPID URINE DRUG SCREEN, HOSP PERFORMED  URINALYSIS, ROUTINE W REFLEX MICROSCOPIC  CBG MONITORING, ED  I-STAT CHEM 8, ED    EKG None  Radiology No results found.  Procedures Procedures    Medications Ordered in ED Medications  LORazepam (ATIVAN) injection 0-4 mg (has no administration in time range)    Or  LORazepam (ATIVAN) tablet 0-4 mg (has no administration in time range)  LORazepam (ATIVAN) injection 0-4 mg (has no administration in time range)    Or  LORazepam (ATIVAN) tablet 0-4 mg (has no administration in time  range)  thiamine (VITAMIN B1) tablet 100 mg (has no administration in time range)    Or  thiamine (VITAMIN B1) injection 100 mg (has no administration in time range)    ED Course/ Medical Decision Making/ A&P Clinical Course as of 11/26/22 1744  Wed Nov 26, 2022  1232 MCV(!): 101.5 [AH]  1232 AST(!): 174 [AH]  1232 ALT(!): 47 [AH]  1232 Anion gap(!): 22 [AH]  1332 Per pts father at bedside this is his 3rd seizure.  Case discussed with Dr. Leonel Ramsay who recommends starting the patient on Keppra and outpatient follow-up with neurology [AH]    Clinical Course User Index [AH] Margarita Mail, PA-C                             Medical Decision Making This patient presents to the ED for concern of seizure, this involves an extensive number of treatment options, and is a complaint that carries with it a high risk of complications and morbidity.  The differential diagnosis for includes but is not limited to idiopathic seizure, traumatic brain injury, intracranial hemorrhage, vascular lesion, mass or space containing lesion, degenerative neurologic disease, congenital brain abnormality, infectious etiology such as meningitis, encephalitis or abscess, metabolic disturbance including hyper or hypoglycemia, hyper or hyponatremia, hyperosmolar state, uremia, hepatic failure, hypocalcemia, hypomagnesemia.  Toxic substances such as cocaine, lidocaine, antidepressants, theophylline, alcohol withdrawal, drug withdrawal, eclampsia, hypertensive encephalopathy and anoxic brain injury.    Co morbidities:      Alcohol use disoreder  Social Determinants of Health:       Strong family support  Additional history:  {Additional history obtained fromEMS, Father   Lab Tests:  I Ordered, and personally interpreted labs.  The pertinent results include:   CMP with slightly elevated glucose of 111, AST to ALT 2-1 pattern consistent with history of alcohol use disorder, ethanol level less than 10, anion  gap of 22 likely lactic acidosis in the setting of seizure CBC with macrocytic anemia, platelet count of 114 likely due to his chronic alcohol misuse, mildly low stress level of insignificant value, mag within normal limits  I ordered imaging studies including CT head without contrast, CT C-spine I independently visualized and interpreted imaging which showed no acute findings I agree with the radiologist interpretation  Cardiac Monitoring/ECG:       The patient was maintained on a cardiac monitor.  I personally viewed and interpreted the cardiac monitored which showed an underlying rhythm of: Sinus rhythm  Medicines ordered and prescription drug management:  I ordered medication including Medications LORazepam (ATIVAN) injection 0-4 mg ( Intravenous Not Given 11/26/22 1045)   Or LORazepam (ATIVAN) tablet 0-4 mg ( Oral See Alternative 11/26/22 1045) LORazepam (ATIVAN) injection 0-4 mg (has no administration  in time range)   Or LORazepam (ATIVAN) tablet 0-4 mg (has no administration in time range) thiamine (VITAMIN B1) tablet 100 mg ( Oral See Alternative 11/26/22 1302)   Or thiamine (VITAMIN B1) injection 100 mg (100 mg Intravenous Given 11/26/22 1302) fentaNYL (SUBLIMAZE) injection 50 mcg (50 mcg Intravenous Given 11/26/22 1302)  ondansetron (ZOFRAN) injection 4 mg (4 mg Intravenous Given 11/26/22 1302) for CIWA protocol, tongue injury, seizure Reevaluation of the patient after these medicines showed that the patient resolved I have reviewed the patients home medicines and have made adjustments as needed  Test Considered:       MRI or EEG although patient does not emergently  Critical Interventions:       Fluids, Keppra loading  Consultations Obtained: Spoke with Dr. Leonel Ramsay who recommended starting the patient on Keppra giving 1000 mg loading dose  Problem List / ED Course:       (R56.9) Seizure  (primary encounter diagnosis)  (F10.90) Alcohol use disorder   MDM: Patient  here with third unprovoked seizure in the setting of also chronic alcohol use.  Patient does not appear to be having withdrawal seizures.  Will start him on Keppra and have him go to see neurology after ambulatory referral given.  Keppra 1000 mg twice daily ordered.  Patient given seizure precautions including driving precautions.  He states he does not drive.  Father is at bedside and states that he also knows the patient does not drive.  Patient also given outpatient resources for substance abuse counseling and inpatient substance abuse rehabilitation.   Dispostion:  After consideration of the diagnostic results and the patients response to treatment, I feel that the patent would benefit from discharge.    Amount and/or Complexity of Data Reviewed Labs: ordered. Decision-making details documented in ED Course. Radiology: ordered.  Risk OTC drugs. Prescription drug management.           Final Clinical Impression(s) / ED Diagnoses Final diagnoses:  None    Rx / DC Orders ED Discharge Orders     None         Margarita Mail, PA-C 11/26/22 1749    Carmin Muskrat, MD 11/27/22 [phone]

## 2022-11-26 NOTE — ED Notes (Addendum)
Pt states he spokes marijuana daily. Pt states he needs to stop drinking. RN informed pt to not abruptly stop and pt was offered resources. Pt stated he would like some help.  RN will notify EDP

## 2022-11-26 NOTE — ED Notes (Signed)
Pt states he has 8/10 pain from biting his tongue. RN notified EDP

## 2022-11-26 NOTE — Discharge Instructions (Addendum)
Contact a health care provider if: You have another seizure or seizures. Call each time you have a seizure. Your seizure pattern changes. You continue to have seizures with treatment. You have symptoms of an infection or illness. Either of these might increase your risk of having a seizure. You are unable to take your medicine. Get help right away if: You have: A seizure that does not stop after 5 minutes. Several seizures in a row without a complete recovery between seizures. A seizure that makes it harder to breathe. A seizure that leaves you unable to speak or use a part of your body. You do not wake up right away after a seizure. You injure yourself during a seizure. You have confusion or pain right after a seizure. These symptoms may represent a serious problem that is an emergency. Do not wait to see if the symptoms will go away. Get medical help right away. Call your local emergency services (911 in the [place] Do not drive yourself to the hospital.  Department of Motor Vehicle [facility]) of [place] regulations for seizures - It is the patient's responsibility to report the incidence of the seizure in the state of [place]. [place] has no statutory provision requiring physicians to report patients diagnosed with epilepsy or seizures to a central state agency.  The recommended DMV regulation requirement for a driver in [place] for an individual with a seizure is that they be seizure-free for 6-12 months. However, the DMV may consider the following exceptions to this general rule where: (1) a physician-directed change in medication causes a seizure and the individual immediately resumes the previous therapy which controlled seizures; (2) there is a history of nocturnal seizures or seizures which do not involve loss of consciousness, loss of control of motor function, or loss of appropriate sensation and information process; and (3) an individual has a seizure disorder preceded by an aura (warning) lasting  2-3 minutes. While the [facility] may also give consideration to other unusual circumstances which may affect the general requirement that drivers be seizure-free for 6-12 months, interpretation of these circumstances and assignment of restrictions is at the discretion of the Medical Advisor. The DMV also considers compliance with medical therapy essential for safe driving. [facility] Physician's Guide to [employer] (June, 1995 ed.)] The Department learns of an individual's condition by inquiring on the application form or renewal form, a physician's report to the [facility], an accident report or from correspondence from the individual. The person may be required to submit a Medical Report Form either annually or semi-annually.  Do not drive, swim, take baths or do any other activities that would be dangerous if you had another seizure.   Contact a health care provider if: You have another seizure. You have seizures more often. Your seizure symptoms change. You continue to have seizures with treatment. You have symptoms of an infection or illness. They might increase your risk of having a seizure. Get help right away if: You have a seizure: That lasts longer than 5 minutes. That is different than previous seizures. That leaves you unable to speak or use a part of your body. That makes it harder to breathe. After a head injury. You have: Multiple seizures in a row. Confusion or a severe headache right after a seizure. You are having seizures more often. You do not wake up immediately after a seizure. You injure yourself during a seizure.

## 2022-11-26 NOTE — ED Notes (Signed)
Pt bit through tongue when having a seizure. EDP aware

## 2022-12-15 ENCOUNTER — Encounter: Payer: Managed Care, Other (non HMO) | Admitting: Student

## 2023-08-28 ENCOUNTER — Other Ambulatory Visit: Payer: Self-pay | Admitting: Student

## 2023-08-28 DIAGNOSIS — I10 Essential (primary) hypertension: Secondary | ICD-10-CM

## 2024-09-03 ENCOUNTER — Emergency Department (HOSPITAL_COMMUNITY)
Admission: EM | Admit: 2024-09-03 | Discharge: 2024-09-03 | Disposition: A | Discharge status: Home / Self Care | Payer: Self-pay | Attending: Emergency Medicine | Admitting: Emergency Medicine

## 2024-09-03 ENCOUNTER — Encounter (HOSPITAL_COMMUNITY): Payer: Self-pay

## 2024-09-03 ENCOUNTER — Emergency Department (HOSPITAL_COMMUNITY): Payer: Self-pay

## 2024-09-03 ENCOUNTER — Other Ambulatory Visit: Payer: Self-pay

## 2024-09-03 DIAGNOSIS — Z79899 Other long term (current) drug therapy: Secondary | ICD-10-CM | POA: Insufficient documentation

## 2024-09-03 DIAGNOSIS — S7002XA Contusion of left hip, initial encounter: Secondary | ICD-10-CM | POA: Insufficient documentation

## 2024-09-03 DIAGNOSIS — I10 Essential (primary) hypertension: Secondary | ICD-10-CM | POA: Insufficient documentation

## 2024-09-03 DIAGNOSIS — W172XXA Fall into hole, initial encounter: Secondary | ICD-10-CM | POA: Insufficient documentation

## 2024-09-03 NOTE — ED Provider Notes (Signed)
 " [place] EM[facility]GENCY DEPARTMENT AT [facility] Provider Note   CSN: [id number] Arrival date & time: 09/03/24  1505     Patient presents with: Warren Roth   Warren Roth is a 49 y.o. male.    Fall   Patient has history of hypertension seizure disorder.  Patient states he is here visiting family.  Patient states he fell through a hole and landed on his left hip.  He thinks he may have fallen about 6 to 8 feet.  Patient states he only landed on his left hip did not injure anything else.  He was able to ambulate.  There is a large area of swelling on his left hip area that he thought could be a bone sticking out.    Prior to Admission medications  Medication Sig Start Date End Date Taking? Authorizing Provider  levETIRAcetam  (KEPPRA ) 1000 MG tablet Take 1 tablet (1,000 mg total) by mouth 2 (two) times daily. 11/26/22   Harris, Abigail, PA-C  lisinopril  (ZESTRIL ) 10 MG tablet Take 1 tablet (10 mg total) by mouth daily. 06/20/22 06/15/23  Zheng, Michael, DO    Allergies: Other    Review of Systems  Updated Vital Signs BP (!) 134/99 (BP Location: Right Arm)   Pulse (!) 101   Temp 98.5 F (36.9 C)   Resp 16   Ht 1.676 m (5' 6)   Wt 59 kg   SpO2 97%   BMI 20.98 kg/m   Physical Exam Vitals and nursing note reviewed.  Constitutional:      General: He is not in acute distress.    Appearance: He is well-developed.  HENT:     Head: Normocephalic and atraumatic.     Right Ear: External ear normal.     Left Ear: External ear normal.  Eyes:     General: No scleral icterus.       Right eye: No discharge.        Left eye: No discharge.     Conjunctiva/sclera: Conjunctivae normal.  Neck:     Trachea: No tracheal deviation.  Cardiovascular:     Rate and Rhythm: Normal rate.  Pulmonary:     Effort: Pulmonary effort is normal. No respiratory distress.     Breath sounds: No stridor.  Abdominal:     General: There is no distension.  Musculoskeletal:        General: No  swelling or deformity.     Cervical back: Normal and neck supple.     Thoracic back: Normal.     Lumbar back: Normal.     Left knee: Normal.     Comments: Firm superficial hematoma noted on the lateral aspect of the hip, tense the skin,, superficial abrasion  Skin:    General: Skin is warm and dry.     Findings: No rash.  Neurological:     Mental Status: He is alert. Mental status is at baseline.     Cranial Nerves: No dysarthria or facial asymmetry.     Motor: No seizure activity.     (all labs ordered are listed, but only abnormal results are displayed) Labs Reviewed - No data to display  EKG: None  Radiology: DG Hip Unilat With Pelvis 2-3 Views Left Result Date: 09/03/2024 CLINICAL DATA:  Status post fall. EXAM: DG HIP (WITH OR WITHOUT PELVIS) 2-3V LEFT COMPARISON:  None Available. FINDINGS: There is no evidence of hip fracture or dislocation. There is no evidence of arthropathy or other focal bone abnormality. IMPRESSION:  Negative. Electronically Signed   By: Suzen Dials M.D.   On: 09/03/2024 16:24     Procedures   Medications Ordered in the ED - No data to display  Clinical Course as of 09/03/24 1630  Sat Sep 03, 2024  1628 DG Hip Unilat With Pelvis 2-3 Views Left X-ray without signs of fracture [JK]    Clinical Course User Index [JK] Randol Simmonds, MD                                 Medical Decision Making Amount and/or Complexity of Data Reviewed Radiology: ordered. Decision-making details documented in ED Course.   Patient's x-rays do not show any signs of fracture.  Patient has been able to ambulate.  He is not having any back pain he did not hit his head or lose consciousness.  No signs of any other serious injury.  Findings are consistent with a contusion.  Discussed ice over-the-counter medications as needed.     Final diagnoses:  Contusion of left hip, initial encounter    ED Discharge Orders     None          Randol Simmonds,  MD 09/03/24 1630  "

## 2024-09-03 NOTE — Discharge Instructions (Signed)
 Apply ice to help with the swelling.  It may take a few weeks for the swelling to completely resolve.  You can also apply an over-the-counter antibiotic ointment to the skin abrasion.

## 2024-09-03 NOTE — ED Triage Notes (Signed)
 Pt states he fell out of a vent and landed on left hip. Denies hitting head, no LOC. Pt ambulatory. Pt states his bone is sticking out of left hip.

## 2024-09-03 NOTE — ED Notes (Signed)
 Pt Aox4 with patent airway. Pt states he fell 8 feet into a vent when the floor caved in. Denies any LOC, head trauma, no c-spine tenderness. Not on blood thinners. Pt reports that he landed on his left hip. See media for pictures of the injured left hip
